# Patient Record
Sex: Male | Born: 2000 | Race: Black or African American | Hispanic: No | Marital: Single | Smoking: Never smoker
Health system: Southern US, Community
[De-identification: ages and names within clinical notes are randomized; demographics above are authoritative.]

## PROBLEM LIST (undated history)

## (undated) HISTORY — PX: WISDOM TOOTH EXTRACTION: SHX21

---
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
## 2022-04-02 ENCOUNTER — Ambulatory Visit (HOSPITAL_COMMUNITY)
Admission: EM | Admit: 2022-04-02 | Discharge: 2022-04-02 | Disposition: A | Discharge status: Home / Self Care | Payer: Medicaid Other | Attending: Internal Medicine | Admitting: Internal Medicine

## 2022-04-02 ENCOUNTER — Encounter (HOSPITAL_COMMUNITY): Payer: Self-pay

## 2022-04-02 DIAGNOSIS — Z202 Contact with and (suspected) exposure to infections with a predominantly sexual mode of transmission: Secondary | ICD-10-CM | POA: Diagnosis not present

## 2022-04-02 NOTE — Discharge Instructions (Signed)

## 2022-04-02 NOTE — ED Provider Notes (Signed)
[place]    CSN: [id number] Arrival date & time: 04/02/22  1855      History   Chief Complaint Chief Complaint  Patient presents with   Exposure to STD    HPI Jeff Love is a 22 y.o. male.   Patient presents to urgent care for routine STD testing. He is not experiencing any symptoms. Reports new sexual partner in October 2023, denies recent sexual intercourse since. No history of HSV-2. Denies urinary symptoms, abdominal pain, nausea, vomiting, diarrhea, and flank pain. No penile discharge, penile rash, or penile itching. No further complaints.   Exposure to STD    History reviewed. No pertinent past medical history.  There are no problems to display for this patient.   History reviewed. No pertinent surgical history.     Home Medications    Prior to Admission medications   Not on File    Family History Family History  Problem Relation Age of Onset   Diabetes Mother    Diabetes Father     Social History Social History   Tobacco Use   Smoking status: Passive Smoke Exposure - Never Smoker   Smokeless tobacco: Never  Substance Use Topics   Alcohol use: No   Drug use: No     Allergies   Penicillins   Review of Systems Review of Systems Per HPI  Physical Exam Triage Vital Signs ED Triage Vitals  Enc Vitals Group     BP 04/02/22 1922 131/86     Pulse Rate 04/02/22 1922 78     Resp 04/02/22 1922 16     Temp 04/02/22 1922 98.4 F (36.9 C)     Temp Source 04/02/22 1922 Oral     SpO2 04/02/22 1922 98 %     Weight --      Height --      Head Circumference --      Peak Flow --      Pain Score 04/02/22 1924 0     Pain Loc --      Pain Edu? --      Excl. in [place]? --    No data found.  Updated Vital Signs BP 131/86 (BP Location: Left Arm)   Pulse 78   Temp 98.4 F (36.9 C) (Oral)   Resp 16   SpO2 98%   Visual Acuity Right Eye Distance:   Left Eye Distance:   Bilateral Distance:    Right Eye Near:   Left Eye Near:     Bilateral Near:     Physical Exam Vitals and nursing note reviewed.  Constitutional:      Appearance: He is not ill-appearing or toxic-appearing.  HENT:     Head: Normocephalic and atraumatic.     Right Ear: Hearing and external ear normal.     Left Ear: Hearing and external ear normal.     Nose: Nose normal.     Mouth/Throat:     Lips: Pink.  Eyes:     General: Lids are normal. Vision grossly intact. Gaze aligned appropriately.     Extraocular Movements: Extraocular movements intact.     Conjunctiva/sclera: Conjunctivae normal.  Pulmonary:     Effort: Pulmonary effort is normal.  Genitourinary:    Comments: Deferred. Musculoskeletal:     Cervical back: Neck supple.  Skin:    General: Skin is warm and dry.     Capillary Refill: Capillary refill takes less than 2 seconds.     Findings: No rash.  Neurological:  General: No focal deficit present.     Mental Status: He is alert and oriented to person, place, and time. Mental status is at baseline.     Cranial Nerves: No dysarthria or facial asymmetry.  Psychiatric:        Mood and Affect: Mood normal.        Speech: Speech normal.        Behavior: Behavior normal.        Thought Content: Thought content normal.        Judgment: Judgment normal.      UC Treatments / Results  Labs (all labs ordered are listed, but only abnormal results are displayed) Labs Reviewed  CYTOLOGY, (ORAL, ANAL, URETHRAL) ANCILLARY ONLY    EKG   Radiology No results found.  Procedures Procedures (including critical care time)  Medications Ordered in UC Medications - No data to display  Initial Impression / Assessment and Plan / UC Course  I have reviewed the triage vital signs and the nursing notes.  Pertinent labs & imaging results that were available during my care of the patient were reviewed by me and considered in my medical decision making (see chart for details).   1. Possible exposure to STD STI labs pending.   Patient declines HIV and syphilis testing today.  Will notify patient of positive results and treat accordingly when labs come back.  Patient to avoid sexual intercourse until screening testing comes back.  Education provided regarding safe sexual practices and patient encouraged to use protection to prevent spread of STIs.   Discussed physical exam and available lab work findings in clinic with patient.  Counseled patient regarding appropriate use of medications and potential side effects for all medications recommended or prescribed today. Discussed red flag signs and symptoms of worsening condition,when to call the PCP office, return to urgent care, and when to seek higher level of care in the emergency department. Patient verbalizes understanding and agreement with plan. All questions answered. Patient discharged in stable condition.    Final Clinical Impressions(s) / UC Diagnoses   Final diagnoses:  Possible exposure to STD     Discharge Instructions      Your STD testing has been sent to the lab and will come back in the next 2 to 3 days.  We will call you if any of your results are positive requiring treatment and treat you at that time.   If you do not receive a phone call from [place], this means your testing was negative.  Avoid sexual intercourse until your STD results come back.  If any of your STD results are positive, you will need to avoid sexual intercourse for 7 days while you are being treated to prevent spread of STD.  Condom use is the best way to prevent spread of STDs.  Return to urgent care as needed.     ED Prescriptions   None    PDMP not reviewed this encounter.   Talbot Grumbling, [place] 04/02/22 2138

## 2022-04-02 NOTE — ED Triage Notes (Signed)
Pt presents to the office for STD-testing. Denies any symptoms at this time.

## 2022-04-03 LAB — CYTOLOGY, (ORAL, ANAL, URETHRAL) ANCILLARY ONLY
Chlamydia: NEGATIVE
Comment: NEGATIVE
Comment: NEGATIVE
Comment: NORMAL
Neisseria Gonorrhea: NEGATIVE
Trichomonas: NEGATIVE

## 2022-06-30 ENCOUNTER — Encounter (HOSPITAL_COMMUNITY): Payer: Self-pay | Admitting: Emergency Medicine

## 2022-06-30 ENCOUNTER — Ambulatory Visit (HOSPITAL_COMMUNITY)
Admission: EM | Admit: 2022-06-30 | Discharge: 2022-06-30 | Disposition: A | Discharge status: Home / Self Care | Payer: Medicaid Other | Attending: Internal Medicine | Admitting: Internal Medicine

## 2022-06-30 DIAGNOSIS — H1013 Acute atopic conjunctivitis, bilateral: Secondary | ICD-10-CM

## 2022-06-30 DIAGNOSIS — J309 Allergic rhinitis, unspecified: Secondary | ICD-10-CM

## 2022-06-30 MED ORDER — BENZONATATE 100 MG PO CAPS: 100.0000 mg | ORAL_CAPSULE | Freq: Three times a day (TID) | ORAL | 0 refills | Status: DC

## 2022-06-30 MED ORDER — OLOPATADINE HCL 0.1 % OP SOLN: 1.0000 [drp] | Freq: Two times a day (BID) | OPHTHALMIC | 12 refills | Status: DC

## 2022-06-30 MED ORDER — FLUTICASONE PROPIONATE 50 MCG/ACT NA SUSP: 1.0000 | Freq: Every day | NASAL | 2 refills | Status: DC

## 2022-06-30 MED ORDER — CETIRIZINE HCL 10 MG PO TABS: 10.0000 mg | ORAL_TABLET | Freq: Every day | ORAL | 2 refills | Status: DC

## 2022-06-30 NOTE — Discharge Instructions (Addendum)
Your symptoms are likely due to environmental allergies.  Avoid exposure to allergens. Take oral antihistamine (Either Zyrtec, Claritin, or Allegra) and use Flonase daily as directed. You can buy these medications over the counter. These medications can take a few days to fully kick in to your body and start working. Return for any new or worsening symptoms.  If your eyes become itchy, you may purchase olopatadine (Pataday) eyedrops over the counter and use as directed to relieve watery/itchy eyes associated with allergies as well.   Tessalon perles every 8 hours as needed for cough.  If your symptoms are severe, please go to the emergency room for further evaluation. Schedule an appointment with your primary care provider for follow-up and further management of your seasonal allergies as well as ongoing preventive healthcare. I hope you feel better!   

## 2022-06-30 NOTE — ED Triage Notes (Signed)
For over a week having cough, congestion, watery eyes. Took Claritin.

## 2022-07-07 NOTE — ED Provider Notes (Signed)
[facility]    CSN: [id number] Arrival date & time: 06/30/22  1314      History   Chief Complaint Chief Complaint  Patient presents with   Cough   Nasal Congestion    HPI Jeff Love is a 22 y.o. male.   Patient presents to urgent care for evaluation of cough, congestion, sneezing, and watery/itchy eyes for the last approximately 1 week. Denies blurry vision and use of contacts/glasses for vision correction. History of seasonal allergies. No fever, chills, dizziness, chest pain, shortness of breath, wheezing, fatigue, body aches, and history of chronic respiratory problems. No N/V/D, rash, sore throat, or headaches. Non-smoker, denies drug use. Has been using Claritin with some relief.    Cough   History reviewed. No pertinent past medical history.  There are no problems to display for this patient.   Past Surgical History:  Procedure Laterality Date   WISDOM TOOTH EXTRACTION         Home Medications    Prior to Admission medications   Medication Sig Start Date End Date Taking? Authorizing Provider  benzonatate (TESSALON) 100 MG capsule Take 1 capsule (100 mg total) by mouth every 8 (eight) hours. 06/30/22  Yes Carlisle Beers, FNP  cetirizine (ZYRTEC) 10 MG tablet Take 1 tablet (10 mg total) by mouth daily. 06/30/22  Yes Carlisle Beers, FNP  fluticasone (FLONASE) 50 MCG/ACT nasal spray Place 1 spray into both nostrils daily. 06/30/22  Yes Carlisle Beers, FNP  olopatadine (PATADAY) 0.1 % ophthalmic solution Place 1 drop into both eyes 2 (two) times daily. 06/30/22  Yes StanhopeDonavan Burnet, FNP    Family History Family History  Problem Relation Age of Onset   Diabetes Mother    Diabetes Father     Social History Social History   Tobacco Use   Smoking status: Never    Passive exposure: Yes   Smokeless tobacco: Never  Substance Use Topics   Alcohol use: No   Drug use: No     Allergies   Penicillins   Review of  Systems Review of Systems  Respiratory:  Positive for cough.   Per HPI   Physical Exam Triage Vital Signs ED Triage Vitals  Enc Vitals Group     BP 06/30/22 1354 (!) 140/83     Pulse Rate 06/30/22 1354 70     Resp 06/30/22 1354 14     Temp 06/30/22 1354 99.3 F (37.4 C)     Temp Source 06/30/22 1354 Oral     SpO2 06/30/22 1354 100 %     Weight --      Height --      Head Circumference --      Peak Flow --      Pain Score 06/30/22 1353 0     Pain Loc --      Pain Edu? --      Excl. in [place]? --    No data found.  Updated Vital Signs BP (!) 140/83 (BP Location: Left Arm)   Pulse 70   Temp 99.3 F (37.4 C) (Oral)   Resp 14   SpO2 100%   Visual Acuity Right Eye Distance:   Left Eye Distance:   Bilateral Distance:    Right Eye Near:   Left Eye Near:    Bilateral Near:     Physical Exam Vitals and nursing note reviewed.  Constitutional:      Appearance: He is not ill-appearing or toxic-appearing.  HENT:  Head: Normocephalic and atraumatic.     Right Ear: Hearing, tympanic membrane, ear canal and external ear normal.     Left Ear: Hearing, tympanic membrane, ear canal and external ear normal.     Nose: Rhinorrhea present.     Mouth/Throat:     Lips: Pink.     Mouth: Mucous membranes are moist. No injury.     Tongue: No lesions. Tongue does not deviate from midline.     Palate: No mass and lesions.     Pharynx: Oropharynx is clear. Uvula midline. No pharyngeal swelling, oropharyngeal exudate, posterior oropharyngeal erythema or uvula swelling.     Tonsils: No tonsillar exudate or tonsillar abscesses.  Eyes:     General: Lids are normal. Vision grossly intact. Gaze aligned appropriately.     Extraocular Movements: Extraocular movements intact.     Conjunctiva/sclera: Conjunctivae normal.  Cardiovascular:     Rate and Rhythm: Normal rate and regular rhythm.     Heart sounds: Normal heart sounds, S1 normal and S2 normal.  Pulmonary:     Effort: Pulmonary  effort is normal. No respiratory distress.     Breath sounds: Normal breath sounds and air entry.  Musculoskeletal:     Cervical back: Neck supple.  Skin:    General: Skin is warm and dry.     Capillary Refill: Capillary refill takes less than 2 seconds.     Findings: No rash.  Neurological:     General: No focal deficit present.     Mental Status: He is alert and oriented to person, place, and time. Mental status is at baseline.     Cranial Nerves: No dysarthria or facial asymmetry.  Psychiatric:        Mood and Affect: Mood normal.        Speech: Speech normal.        Behavior: Behavior normal.        Thought Content: Thought content normal.        Judgment: Judgment normal.      UC Treatments / Results  Labs (all labs ordered are listed, but only abnormal results are displayed) Labs Reviewed - No data to display  EKG   Radiology No results found.  Procedures Procedures (including critical care time)  Medications Ordered in UC Medications - No data to display  Initial Impression / Assessment and Plan / UC Course  I have reviewed the triage vital signs and the nursing notes.  Pertinent labs & imaging results that were available during my care of the patient were reviewed by me and considered in my medical decision making (see chart for details).   1. Allergic conjunctivitis and rhinitis Symptoms and physical exam consistent with allergic rhinitis etiology. Doubt acute viral URI cause of symptoms. No indication for imaging at this time based on stable cardiopulmonary exam and hemodynamically stable vital signs. Advised to avoid known allergens and begin using daily antihistamine cetirizine 10mg  to dry up post-nasal drainage that is likely causing cough. Flonase daily for nasal inflammation and rhinorrhea. Olopatadine eye drops for symptomatic relief related to suspected allergic conjunctivitis/watery itchy eyes. Patient agreeable with plan.   Discussed physical exam and  available lab work findings in clinic with patient.  Counseled patient regarding appropriate use of medications and potential side effects for all medications recommended or prescribed today. Discussed red flag signs and symptoms of worsening condition,when to call the PCP office, return to urgent care, and when to seek higher level of care in the emergency  department. Patient verbalizes understanding and agreement with plan. All questions answered. Patient discharged in stable condition.   Final Clinical Impressions(s) / UC Diagnoses   Final diagnoses:  Allergic conjunctivitis of both eyes and rhinitis     Discharge Instructions      Your symptoms are likely due to environmental allergies.  Avoid exposure to allergens. Take oral antihistamine (Either Zyrtec, Claritin, or Allegra) and use Flonase daily as directed. You can buy these medications over the counter. These medications can take a few days to fully kick in to your body and start working. Return for any new or worsening symptoms.  If your eyes become itchy, you may purchase olopatadine (Pataday) eyedrops over the counter and use as directed to relieve watery/itchy eyes associated with allergies as well.   Tessalon perles every 8 hours as needed for cough.  If your symptoms are severe, please go to the emergency room for further evaluation. Schedule an appointment with your primary care provider for follow-up and further management of your seasonal allergies as well as ongoing preventive healthcare. I hope you feel better!     ED Prescriptions     Medication Sig Dispense Auth. Provider   cetirizine (ZYRTEC) 10 MG tablet Take 1 tablet (10 mg total) by mouth daily. 30 tablet Reita May M, FNP   fluticasone [facility]) 50 MCG/ACT nasal spray Place 1 spray into both nostrils daily. 15.8 mL Reita May M, FNP   olopatadine (PATADAY) 0.1 % ophthalmic solution Place 1 drop into both eyes 2 (two) times daily. 5 mL Reita May M, FNP   benzonatate (TESSALON) 100 MG capsule Take 1 capsule (100 mg total) by mouth every 8 (eight) hours. 21 capsule Carlisle Beers, FNP      PDMP not reviewed this encounter.   Carlisle Beers, [place] 07/07/22 2148

## 2022-09-17 ENCOUNTER — Ambulatory Visit (HOSPITAL_COMMUNITY)
Admission: EM | Admit: 2022-09-17 | Discharge: 2022-09-17 | Disposition: A | Discharge status: Home / Self Care | Payer: Medicaid Other | Attending: Family Medicine | Admitting: Family Medicine

## 2022-09-17 ENCOUNTER — Encounter (HOSPITAL_COMMUNITY): Payer: Self-pay | Admitting: Emergency Medicine

## 2022-09-17 DIAGNOSIS — R3989 Other symptoms and signs involving the genitourinary system: Secondary | ICD-10-CM

## 2022-09-17 LAB — POCT URINALYSIS DIP (MANUAL ENTRY)
Bilirubin, UA: NEGATIVE
Blood, UA: NEGATIVE
Glucose, UA: NEGATIVE mg/dL — NL
Ketones, POC UA: NEGATIVE mg/dL
Leukocytes, UA: NEGATIVE
Nitrite, UA: NEGATIVE
Protein Ur, POC: NEGATIVE mg/dL
Spec Grav, UA: 1.02 (ref 1.010–1.025)
Urobilinogen, UA: 0.2 E.U./dL
pH, UA: 7.5 (ref 5.0–8.0)

## 2022-09-17 NOTE — ED Provider Notes (Signed)
[facility]    CSN: [id number] Arrival date & time: 09/17/22  1616      History   Chief Complaint Chief Complaint  Patient presents with   Hematuria    HPI Jeff Love is a 22 y.o. male.    Hematuria  Here for a pink or red color to his urine.  It was first noticed this morning.  He has no dysuria and no back pain or abdominal pain, except he has had some upper abdominal burning some.  No fever or chills and no nausea or vomiting.  No penile itching or discharge.    History reviewed. No pertinent past medical history.  There are no problems to display for this patient.   Past Surgical History:  Procedure Laterality Date   WISDOM TOOTH EXTRACTION         Home Medications    Prior to Admission medications   Not on File    Family History Family History  Problem Relation Age of Onset   Diabetes Mother    Diabetes Father     Social History Social History   Tobacco Use   Smoking status: Never    Passive exposure: Yes   Smokeless tobacco: Never  Substance Use Topics   Alcohol use: No   Drug use: No     Allergies   Penicillins   Review of Systems Review of Systems  Genitourinary:  Positive for hematuria.     Physical Exam Triage Vital Signs ED Triage Vitals  Encounter Vitals Group     BP 09/17/22 1649 122/79     Systolic BP Percentile --      Diastolic BP Percentile --      Pulse Rate 09/17/22 1649 (!) 56     Resp 09/17/22 1649 13     Temp 09/17/22 1649 98.2 F (36.8 C)     Temp Source 09/17/22 1649 Oral     SpO2 09/17/22 1649 98 %     Weight --      Height --      Head Circumference --      Peak Flow --      Pain Score 09/17/22 1648 0     Pain Loc --      Pain Education --      Exclude from Growth Chart --    No data found.  Updated Vital Signs BP 122/79 (BP Location: Left Arm)   Pulse (!) 56   Temp 98.2 F (36.8 C) (Oral)   Resp 13   SpO2 98%   Visual Acuity Right Eye Distance:   Left Eye Distance:    Bilateral Distance:    Right Eye Near:   Left Eye Near:    Bilateral Near:     Physical Exam Vitals reviewed.  Constitutional:      General: He is not in acute distress.    Appearance: He is not ill-appearing, toxic-appearing or diaphoretic.  Cardiovascular:     Rate and Rhythm: Normal rate and regular rhythm.  Pulmonary:     Effort: Pulmonary effort is normal.     Breath sounds: Normal breath sounds.  Abdominal:     Palpations: Abdomen is soft.     Tenderness: There is no abdominal tenderness. There is no right CVA tenderness or left CVA tenderness.  Skin:    Capillary Refill: Capillary refill takes less than 2 seconds.     Coloration: Skin is not pale.  Neurological:     General: No focal deficit present.  Mental Status: He is alert and oriented to person, place, and time.  Psychiatric:        Behavior: Behavior normal.      UC Treatments / Results  Labs (all labs ordered are listed, but only abnormal results are displayed) Labs Reviewed  POCT URINALYSIS DIP (MANUAL ENTRY) - Abnormal; Notable for the following components:      Result Value   Color, UA light yellow (*)    Clarity, UA hazy (*)    All other components within normal limits    EKG   Radiology No results found.  Procedures Procedures (including critical care time)  Medications Ordered in UC Medications - No data to display  Initial Impression / Assessment and Plan / UC Course  I have reviewed the triage vital signs and the nursing notes.  Pertinent labs & imaging results that were available during my care of the patient were reviewed by me and considered in my medical decision making (see chart for details).       Urinalysis does not show any red blood cells or leuks.  No nitrites.  I am not going to send a culture as he is not having any urinary pain and the UA is normal.  He is still given contact information for urology.  We discussed considering STD screening, but he declines  today.  Final Clinical Impressions(s) / UC Diagnoses   Final diagnoses:  Abnormal urine color     Discharge Instructions      The urinalysis did not show any blood in your urine at the time of the specimen collection.  The pink color could have been from your prostate or other glands.  Make sure you are drinking plenty of fluids, and please call urology for follow-up.     ED Prescriptions   None    PDMP not reviewed this encounter.   Zenia Resides, MD 09/17/22 [phone]

## 2022-09-17 NOTE — ED Triage Notes (Signed)
Pt reports had pinkish-red blood in urine once today around 10 am.

## 2022-09-17 NOTE — Discharge Instructions (Signed)
The urinalysis did not show any blood in your urine at the time of the specimen collection.  The pink color could have been from your prostate or other glands.  Make sure you are drinking plenty of fluids, and please call urology for follow-up.
